# Patient Record
Sex: Female | Born: 1950 | Race: Black or African American | Hispanic: No | Marital: Single | Smoking: Never smoker
Health system: Southern US, Community
[De-identification: ages and names within clinical notes are randomized; demographics above are authoritative.]

## PROBLEM LIST (undated history)

## (undated) DIAGNOSIS — I10 Essential (primary) hypertension: Secondary | ICD-10-CM

---

## 2012-03-19 ENCOUNTER — Emergency Department (HOSPITAL_COMMUNITY): Payer: No Typology Code available for payment source

## 2012-03-19 ENCOUNTER — Emergency Department (HOSPITAL_COMMUNITY)
Admission: EM | Admit: 2012-03-19 | Discharge: 2012-03-20 | Disposition: A | Discharge status: Home / Self Care | Payer: No Typology Code available for payment source | Attending: Emergency Medicine | Admitting: Emergency Medicine

## 2012-03-19 DIAGNOSIS — M25569 Pain in unspecified knee: Secondary | ICD-10-CM | POA: Insufficient documentation

## 2012-03-19 DIAGNOSIS — S335XXA Sprain of ligaments of lumbar spine, initial encounter: Secondary | ICD-10-CM | POA: Insufficient documentation

## 2012-03-19 DIAGNOSIS — S39012A Strain of muscle, fascia and tendon of lower back, initial encounter: Secondary | ICD-10-CM
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
## 2012-03-20 MED ORDER — HYDROCODONE-ACETAMINOPHEN 5-325 MG PO TABS: 1.0000 | ORAL_TABLET | ORAL | Status: AC | PRN

## 2012-03-20 MED ORDER — LORAZEPAM 1 MG PO TABS: 1.0000 mg | ORAL_TABLET | Freq: Three times a day (TID) | ORAL | Status: DC | PRN

## 2012-03-22 ENCOUNTER — Emergency Department (HOSPITAL_COMMUNITY)
Admission: EM | Admit: 2012-03-22 | Discharge: 2012-03-22 | Disposition: A | Discharge status: Home / Self Care | Payer: No Typology Code available for payment source | Attending: Emergency Medicine | Admitting: Emergency Medicine

## 2012-03-22 ENCOUNTER — Encounter (HOSPITAL_COMMUNITY): Payer: Self-pay | Admitting: Family Medicine

## 2012-03-22 DIAGNOSIS — I10 Essential (primary) hypertension: Secondary | ICD-10-CM | POA: Insufficient documentation

## 2012-03-22 DIAGNOSIS — T148XXA Other injury of unspecified body region, initial encounter: Secondary | ICD-10-CM | POA: Insufficient documentation

## 2012-03-22 DIAGNOSIS — Y92410 Unspecified street and highway as the place of occurrence of the external cause: Secondary | ICD-10-CM | POA: Insufficient documentation

## 2012-03-22 HISTORY — DX: Essential (primary) hypertension: I10

## 2012-03-22 MED ORDER — OXYCODONE-ACETAMINOPHEN 5-325 MG PO TABS
2.0000 | ORAL_TABLET | Freq: Once | ORAL | Status: DC
  Filled 2012-03-22: qty 2

## 2012-03-22 MED ORDER — IBUPROFEN 400 MG PO TABS
400.0000 mg | ORAL_TABLET | Freq: Once | ORAL | Status: DC
  Filled 2012-03-22: qty 1

## 2012-03-22 MED ORDER — NAPROXEN 375 MG PO TABS: 375.0000 mg | ORAL_TABLET | Freq: Two times a day (BID) | ORAL | Status: AC

## 2012-03-22 MED ORDER — DIAZEPAM 5 MG PO TABS: 5.0000 mg | ORAL_TABLET | Freq: Three times a day (TID) | ORAL | Status: AC | PRN
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
## 2016-10-11 ENCOUNTER — Other Ambulatory Visit: Payer: Self-pay | Admitting: Internal Medicine

## 2016-10-11 DIAGNOSIS — E2839 Other primary ovarian failure: Secondary | ICD-10-CM

## 2016-10-11 DIAGNOSIS — Z1231 Encounter for screening mammogram for malignant neoplasm of breast: Secondary | ICD-10-CM

## 2016-10-18 ENCOUNTER — Encounter: Payer: Self-pay | Admitting: Gastroenterology

## 2016-10-26 ENCOUNTER — Other Ambulatory Visit: Payer: Self-pay

## 2016-10-26 ENCOUNTER — Ambulatory Visit
Admission: RE | Admit: 2016-10-26 | Discharge: 2016-10-26 | Disposition: A | Discharge status: Home / Self Care | Payer: Medicare Other | Source: Ambulatory Visit | Attending: Internal Medicine | Admitting: Internal Medicine

## 2016-10-26 DIAGNOSIS — E2839 Other primary ovarian failure: Secondary | ICD-10-CM

## 2016-10-26 DIAGNOSIS — Z1231 Encounter for screening mammogram for malignant neoplasm of breast: Secondary | ICD-10-CM

## 2016-12-15 ENCOUNTER — Telehealth: Payer: Self-pay

## 2016-12-15 NOTE — Telephone Encounter (Signed)
Patient No Showed for Pre-Visit appointment that was scheduled on 12/15/16 @ 10:30 Am. Patient was called to reschedule. No answer. Left message to call [phone] to reschedule PV. If we do not hear from the patient by 5:00 Pm we will cancel colonoscopy that is scheduled on 12/29/16.   Janalee DaneNancy Blakleigh Straw, LPN

## 2016-12-22 ENCOUNTER — Encounter: Payer: Self-pay | Admitting: Gastroenterology

## 2016-12-29 ENCOUNTER — Encounter: Payer: Self-pay | Admitting: Gastroenterology

## 2017-02-10 ENCOUNTER — Encounter: Payer: Self-pay | Admitting: Internal Medicine

## 2017-03-06 ENCOUNTER — Encounter: Payer: Self-pay | Admitting: Gastroenterology

## 2018-11-15 IMAGING — MG DIGITAL SCREENING BILATERAL MAMMOGRAM WITH CAD
5 series · 5 of 5 positions shown · non-contrast
Comparison: Previous exam(s).

CLINICAL DATA: Screening.

EXAM:
DIGITAL SCREENING BILATERAL MAMMOGRAM WITH CAD

[L CC]
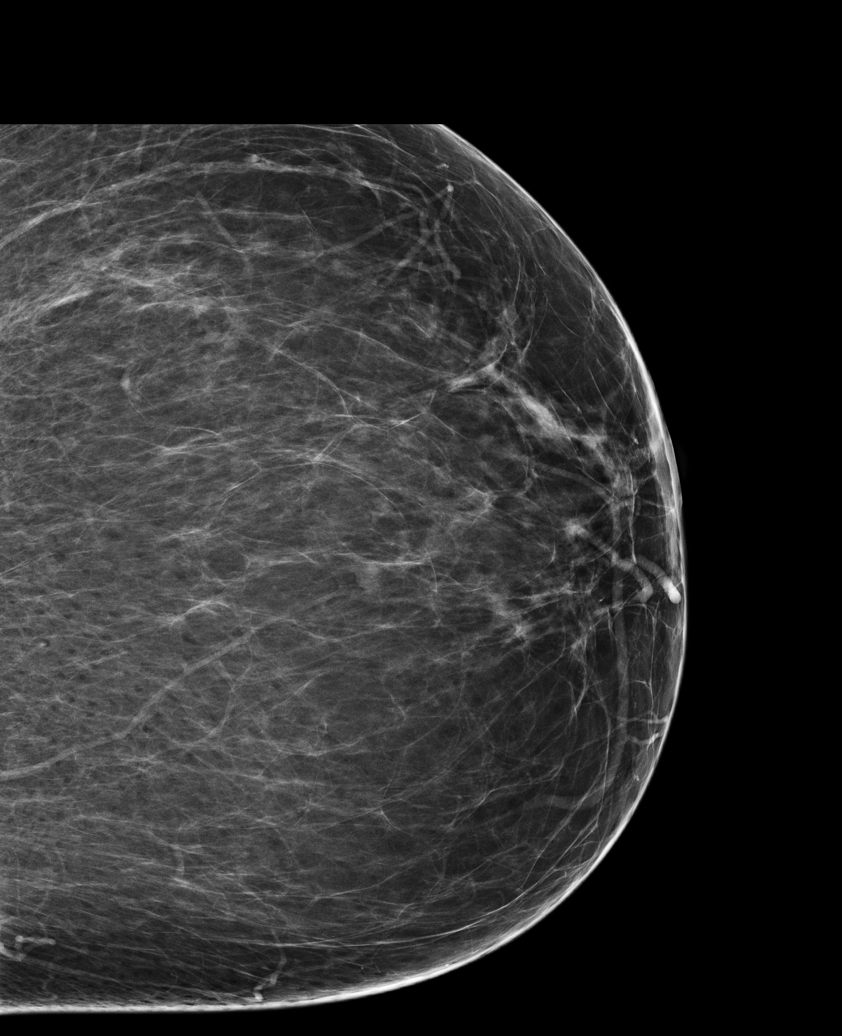

[R MLO (1 of 2)]
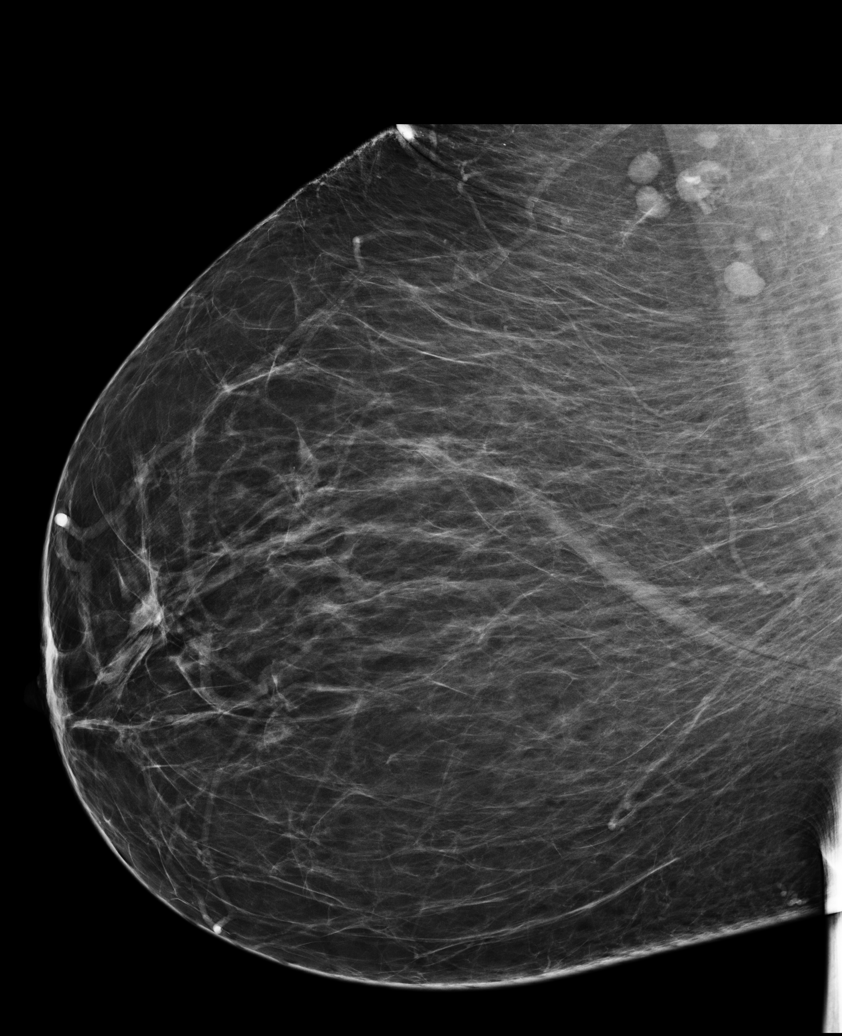

[R CC]
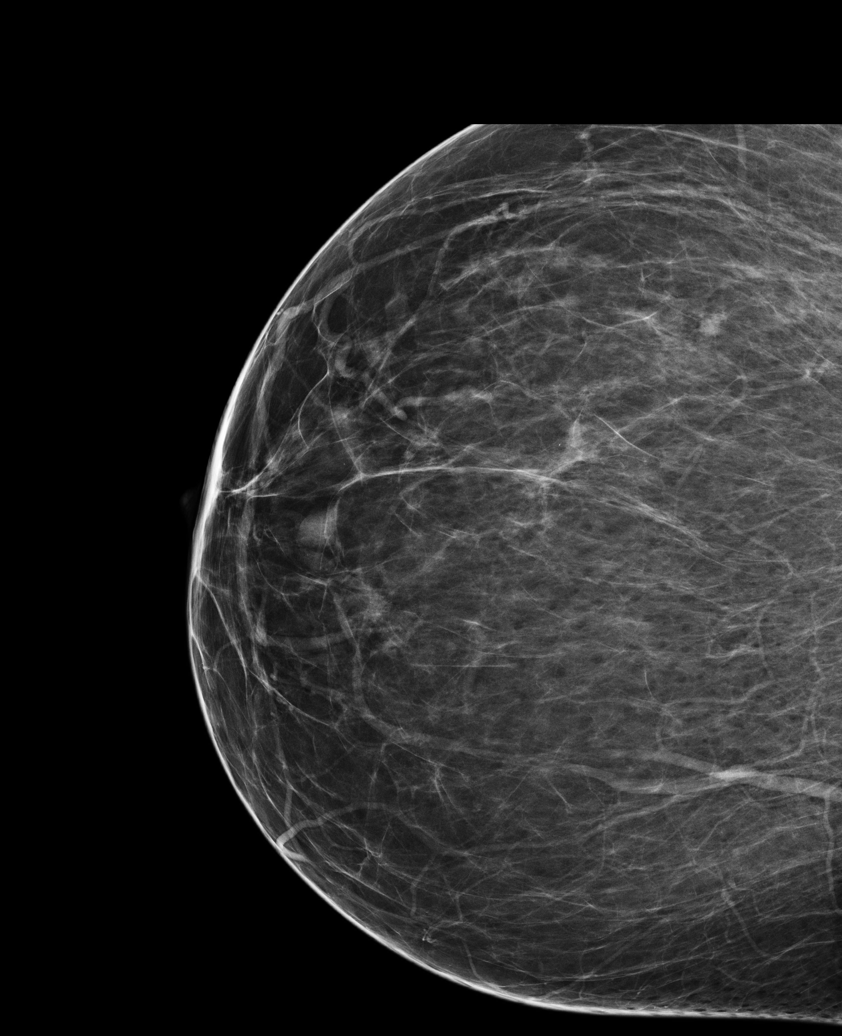

[R MLO (2 of 2)]
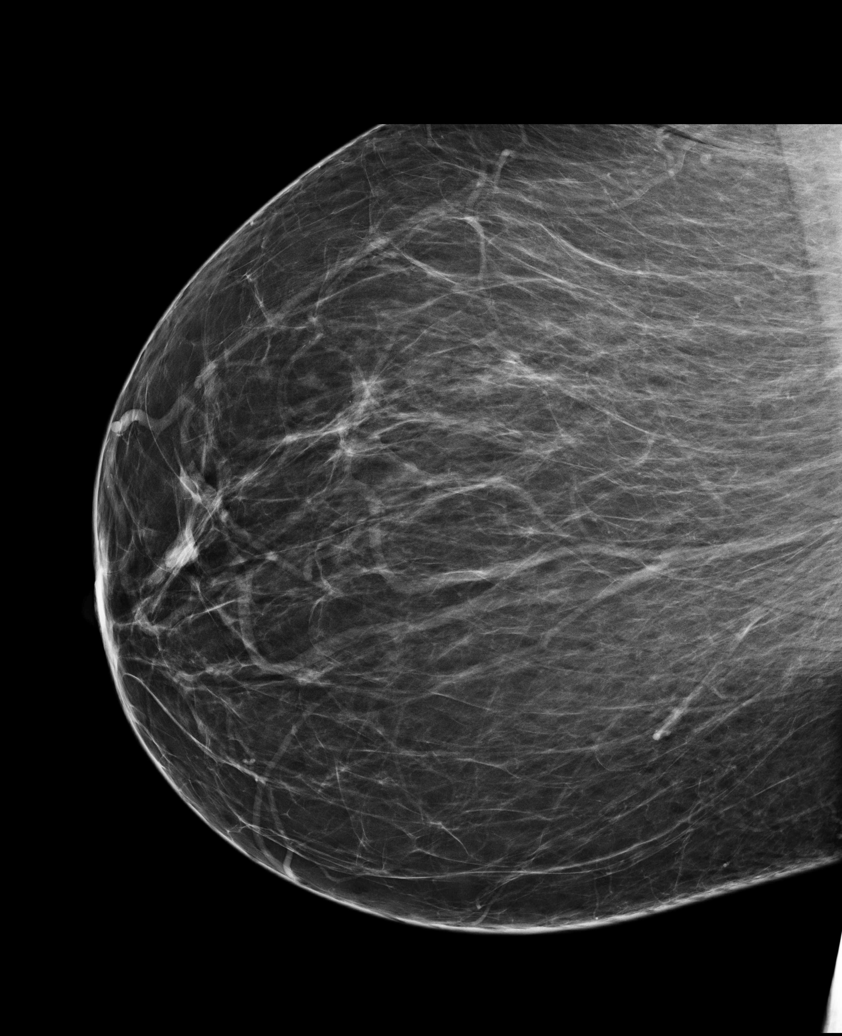

[L MLO]
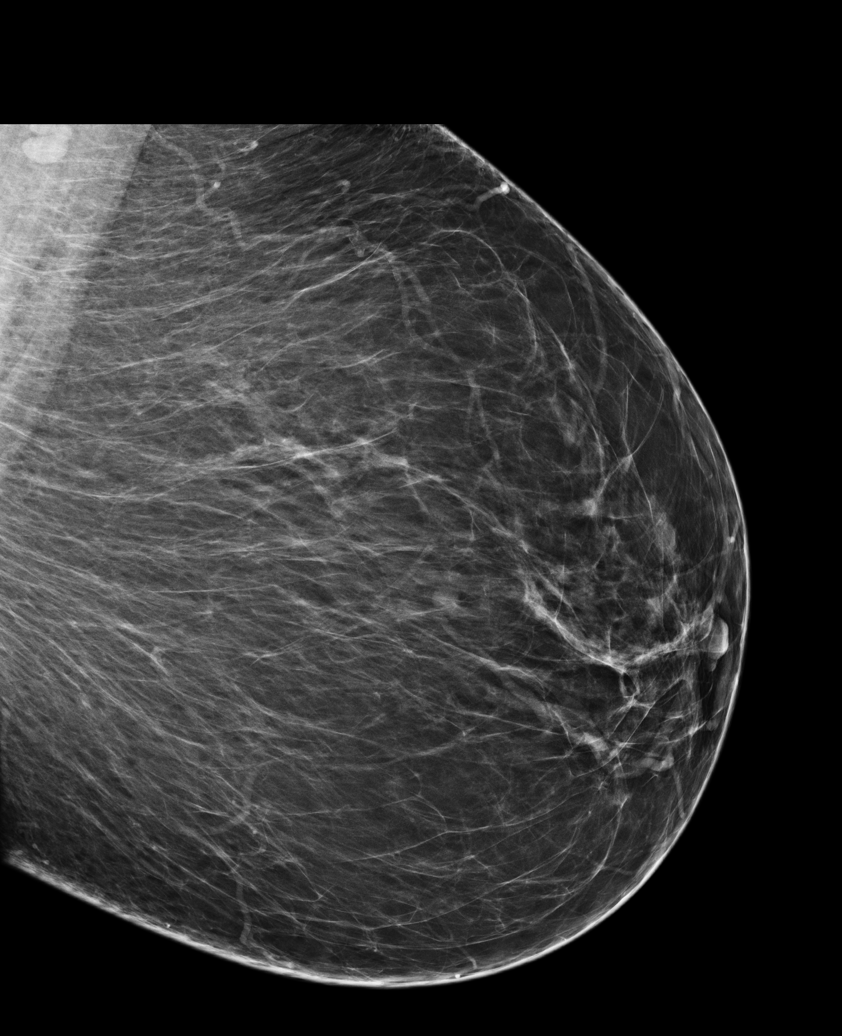

[5 of 5 positions shown; findings below may reference images not displayed]

ACR Breast Density Category b: There are scattered areas of
fibroglandular density.
FINDINGS: There are no findings suspicious for malignancy. Images were
processed with CAD.
IMPRESSION: No mammographic evidence of malignancy. A result letter of this
screening mammogram will be mailed directly to the patient.

RECOMMENDATION:
Screening mammogram in one year. (Code:AS-G-LCT)

BI-RADS CATEGORY  1: Negative.
